# Patient Record
Sex: Female | Born: 1940 | Race: White | Hispanic: No | Marital: Married | Smoking: Never smoker
Health system: Southern US, Community
[De-identification: ages and names within clinical notes are randomized; demographics above are authoritative.]

---

## 2016-03-14 ENCOUNTER — Emergency Department: Payer: Medicare Other

## 2016-03-14 ENCOUNTER — Emergency Department
Admission: EM | Admit: 2016-03-14 | Discharge: 2016-03-14 | Disposition: A | Discharge status: Home / Self Care | Payer: Medicare Other | Attending: Emergency Medicine | Admitting: Emergency Medicine

## 2016-03-14 DIAGNOSIS — S82432A Displaced oblique fracture of shaft of left fibula, initial encounter for closed fracture: Secondary | ICD-10-CM | POA: Diagnosis not present

## 2016-03-14 DIAGNOSIS — S82392A Other fracture of lower end of left tibia, initial encounter for closed fracture: Secondary | ICD-10-CM

## 2016-03-14 DIAGNOSIS — Y92009 Unspecified place in unspecified non-institutional (private) residence as the place of occurrence of the external cause: Secondary | ICD-10-CM | POA: Diagnosis not present

## 2016-03-14 DIAGNOSIS — Y999 Unspecified external cause status: Secondary | ICD-10-CM | POA: Diagnosis not present

## 2016-03-14 DIAGNOSIS — Z79899 Other long term (current) drug therapy: Secondary | ICD-10-CM | POA: Insufficient documentation

## 2016-03-14 DIAGNOSIS — X501XXA Overexertion from prolonged static or awkward postures, initial encounter: Secondary | ICD-10-CM | POA: Diagnosis not present

## 2016-03-14 DIAGNOSIS — Y939 Activity, unspecified: Secondary | ICD-10-CM | POA: Insufficient documentation

## 2016-03-14 DIAGNOSIS — S8252XA Displaced fracture of medial malleolus of left tibia, initial encounter for closed fracture: Secondary | ICD-10-CM | POA: Diagnosis not present

## 2016-03-14 DIAGNOSIS — S8992XA Unspecified injury of left lower leg, initial encounter: Secondary | ICD-10-CM | POA: Diagnosis present

## 2016-03-14 MED ORDER — OXYCODONE-ACETAMINOPHEN 5-325 MG PO TABS: 1.0000 | ORAL_TABLET | Freq: Four times a day (QID) | ORAL | 0 refills | Status: AC | PRN

## 2016-03-14 MED ORDER — OXYCODONE-ACETAMINOPHEN 5-325 MG PO TABS
1.0000 | ORAL_TABLET | Freq: Once | ORAL | Status: AC
  Administered 2016-03-14: 1 via ORAL
  Filled 2016-03-14: qty 1

## 2016-03-14 NOTE — ED Triage Notes (Signed)
Left foot pain since trip and fall at her house. Pt alert and oriented X4, active, cooperative, pt in NAD. RR even and unlabored, color WNL.

## 2016-03-14 NOTE — ED Provider Notes (Signed)
[facility] Emergency Department Provider Note  ____________________________________________  Time seen: Approximately 1:04 PM  I have reviewed the triage vital signs and the nursing notes.   HISTORY  Chief Complaint Fall and Foot Injury    HPI Christina Maldonado is a 76 y.o. female, NAD, presents to emergency department for evaluation of left ankle pain, swelling and bruising. Patient states she was on the way to a painting class when she fell due to missing a step. States she twisted her ankle and felt a pop. Noted significant pain, swelling and bruising immediately after the incident. Has had no numbness, weakness, tingling. Denies any open wounds or lacerations. Denies head injury, loss of consciousness, lightheadedness or dizziness. Denies any neck or back pain.   History reviewed. No pertinent past medical history.  There are no active problems to display for this patient.   History reviewed. No pertinent surgical history.  Prior to Admission medications   Medication Sig Start Date End Date Taking? Authorizing Provider  loratadine (CLARITIN) 10 MG tablet Take 10 mg by mouth daily.   Yes Historical Provider, MD  oxyCODONE-acetaminophen (ROXICET) 5-325 MG tablet Take 1 tablet by mouth every 6 (six) hours as needed. 03/14/16 03/14/17  Jami L Hagler, PA-C    Allergies Patient has no known allergies.  No family history on file.  Social History Social History  Substance Use Topics  . Smoking status: Never Smoker  . Smokeless tobacco: Never Used  . Alcohol use No     Review of Systems  Constitutional: No fever/chills Eyes: No visual changes. Musculoskeletal: Positive left ankle pain. Negative left foot or lower leg pain.  Skin: Positive swelling, bruising left ankle. Negative for rash, redness, abnormal warmth, open wounds. Neurological: Negative for numbness, weakness, tingling.  ____________________________________________   PHYSICAL  EXAM:  VITAL SIGNS: ED Triage Vitals  Enc Vitals Group     BP 03/14/16 1255 (!) 143/87     Pulse Rate 03/14/16 1254 97     Resp 03/14/16 1254 16     Temp 03/14/16 1254 97.6 F (36.4 C)     Temp Source 03/14/16 1254 Oral     SpO2 03/14/16 1254 100 %     Weight 03/14/16 1255 160 lb (72.6 kg)     Height 03/14/16 1255 5\' 2"  (1.575 m)     Head Circumference --      Peak Flow --      Pain Score 03/14/16 1255 6     Pain Loc --      Pain Edu? --      Excl. in GC? --      Constitutional: Alert and oriented. Well appearing and in no acute distress. Eyes: Conjunctivae are normal.  Head: Atraumatic. Cardiovascular: Good peripheral circulation with 2+ pulses noted in the left lower extremity. Capillary refill is brisk in all digits of the left foot. Respiratory: Normal respiratory effort without tachypnea or retractions.  Musculoskeletal: Tenderness to palpation about bilateral sides of the left ankle. No laxity with anterior or posterior drawer. No laxity with varus or valgus stress. No tenderness to palpation about the left foot or toes.  No joint effusions. Neurologic:  Normal speech and language. No gross focal neurologic deficits are appreciated. Sensation light touch grossly intact about the left lower extremity. Skin:  Skin about the left ankle with moderate blue ecchymosis and swelling. Skin is warm, dry and intact. No rash, redness, abnormal warmth, open wounds noted. Psychiatric: Mood and affect are normal. Speech and  behavior are normal. Patient exhibits appropriate insight and judgement.   ____________________________________________   LABS  None ____________________________________________  EKG  None ____________________________________________  RADIOLOGY I, Ernestene Kiel Hagler, personally viewed and evaluated these images (plain radiographs) as part of my medical decision making, as well as reviewing the written report by the radiologist.  Dg Ankle Complete Left  Result  Date: 03/14/2016 CLINICAL DATA:  Initial evaluation for acute trauma. EXAM: LEFT ANKLE COMPLETE - 3+ VIEW COMPARISON:  None available. FINDINGS: There is an acute oblique fracture through the distal fibular shaft with lateral and posterior displacement. Additional fracture of the posterior malleolus without significant displacement. Linear transverse lucency through the distal aspect of the medial malleolus favored to be chronic in nature, with no definite medial malleolar fracture identified. Abnormal widening of the medial ankle mortise to 6 mm. Talar dome remains intact. Diffuse soft tissue swelling about the ankle. IMPRESSION: 1. Acute oblique fracture through the distal fibular shaft with slight lateral and posterior displacement. 2. Acute posterior malleolar fracture without significant displacement. 3. Abnormal widening of the medial ankle mortise to 6 mm. Electronically Signed   By: Rise Mu M.D.   On: 03/14/2016 13:39    ____________________________________________    PROCEDURES  Procedure(s) performed: None   Procedures   Medications  oxyCODONE-acetaminophen (PERCOCET/ROXICET) 5-325 MG per tablet 1 tablet (1 tablet Oral Given 03/14/16 1430)     ____________________________________________   INITIAL IMPRESSION / ASSESSMENT AND PLAN / ED COURSE  Pertinent labs & imaging results that were available during my care of the patient were reviewed by me and considered in my medical decision making (see chart for details).  Clinical Course as of Mar 14 1902  Wed Mar 14, 2016  1515 I spoke with Dr. Rosita Kea in regards to the patient's physical exam and imaging results. He states that the patient will need surgical repair of her fractures. She can be discharged home with posterior and stirrup splint and as long as the patient will be compliant with staying off of the left lower extremity keeping it elevated.The patient follow-up with him on Monday in his office to plan surgery for  next Thursday.  [JH]    Clinical Course User Index [JH] Jami L Hagler, PA-C    Patient's diagnosis is consistent with A closed displaced oblique fracture of the shaft of the left fibula and closed fracture posterior malleolus of the left tibia. Patient was placed in a posterior and stirrup splint and given crutches for supportive care. Patient also notes that she has a bedside toilet as well as wheelchair and walker that is available for her use at home if needed. Patient verbalizes understanding that she must not bear weight about the left lower extremity and must keep the left ankle and foot elevated over the next few days. Patient will be discharged home with prescriptions for Roxicet to take sparingly as needed for severe pain. Patient is to follow up with Dr. Rosita Kea on Monday for further evaluation and treatment of fracture. Patient is given ED precautions to return to the ED for any worsening or new symptoms.    ____________________________________________  FINAL CLINICAL IMPRESSION(S) / ED DIAGNOSES  Final diagnoses:  Closed displaced oblique fracture of shaft of left fibula, initial encounter  Closed fracture of posterior malleolus of left tibia, initial encounter      NEW MEDICATIONS STARTED DURING THIS VISIT:  Discharge Medication List as of 03/14/2016  3:15 PM    START taking these medications   Details  oxyCODONE-acetaminophen (ROXICET) 5-325 MG tablet Take 1 tablet by mouth every 6 (six) hours as needed., Starting Wed 03/14/2016, Until Thu 03/14/2017, Print             Ernestene KielJami L WeatherfordHagler, PA-C 03/14/16 1906    Sharman CheekPhillip Stafford, MD 03/18/16 [phone]

## 2016-03-14 NOTE — ED Notes (Signed)
See triage note   States she fell down steps twisted left foot  Having pain and swelling to left ankle  Unable to bear wt  positive pulses

## 2018-07-14 IMAGING — DX DG ANKLE COMPLETE 3+V*L*
1 series · 1 of 1 positions shown · non-contrast
Comparison: None available.

CLINICAL DATA: Initial evaluation for acute trauma.

EXAM:
LEFT ANKLE COMPLETE - 3+ VIEW

[foot lat]
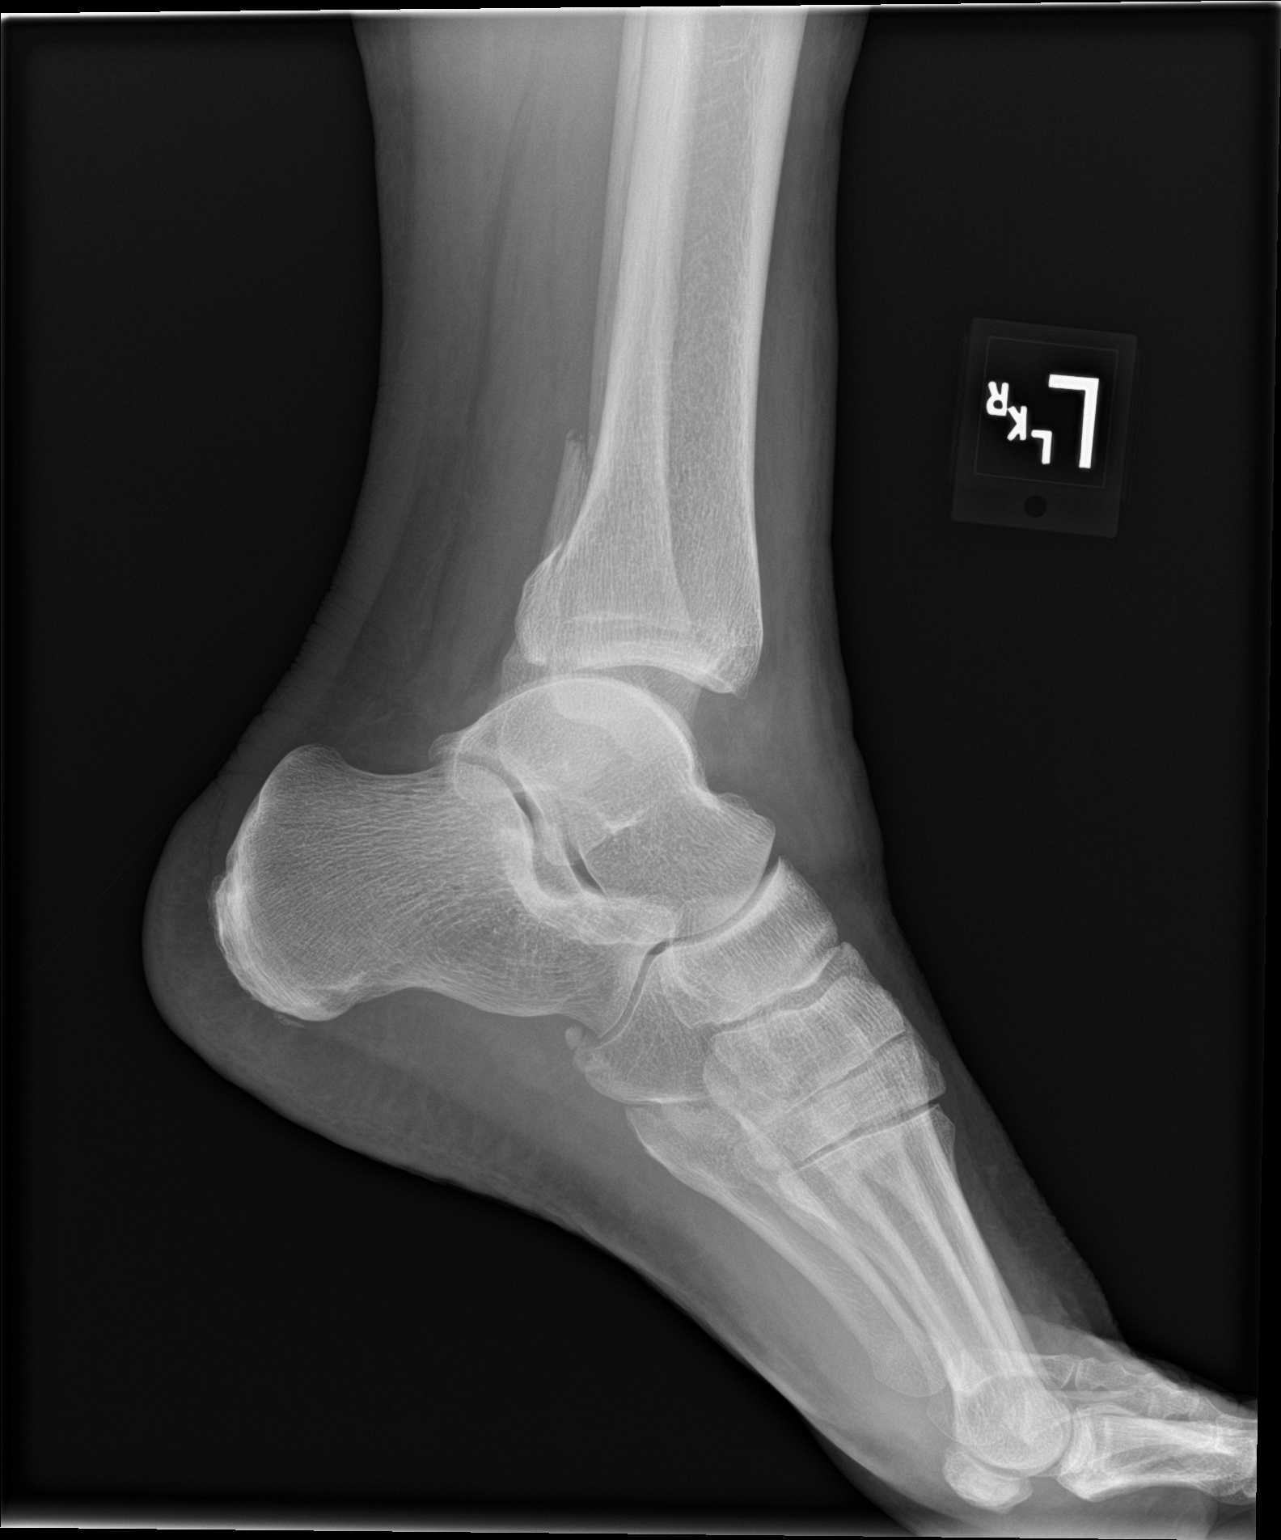

[1 of 1 positions shown; findings below may reference images not displayed]

FINDINGS: There is an acute oblique fracture through the distal fibular shaft
with lateral and posterior displacement. Additional fracture of the
posterior malleolus without significant displacement. Linear
transverse lucency through the distal aspect of the medial malleolus
favored to be chronic in nature, with no definite medial malleolar
fracture identified. Abnormal widening of the medial ankle mortise
to 6 mm. Talar dome remains intact. Diffuse soft tissue swelling
about the ankle.
IMPRESSION: 1. Acute oblique fracture through the distal fibular shaft with
slight lateral and posterior displacement.
2. Acute posterior malleolar fracture without significant
displacement.
3. Abnormal widening of the medial ankle mortise to 6 mm.
